# Patient Record
Sex: Male | Born: 1995 | Hispanic: Yes | Marital: Single | Smoking: Never smoker
Health system: Southern US, Community
[De-identification: ages and names within clinical notes are randomized; demographics above are authoritative.]

## PROBLEM LIST (undated history)

## (undated) ENCOUNTER — Ambulatory Visit: Payer: Self-pay

## (undated) DIAGNOSIS — Z8669 Personal history of other diseases of the nervous system and sense organs: Secondary | ICD-10-CM

## (undated) DIAGNOSIS — J45909 Unspecified asthma, uncomplicated: Secondary | ICD-10-CM

## (undated) HISTORY — PX: TYMPANOSTOMY TUBE PLACEMENT: SHX32

---

## 2016-10-19 ENCOUNTER — Emergency Department (HOSPITAL_COMMUNITY): Payer: No Typology Code available for payment source

## 2016-10-19 ENCOUNTER — Encounter (HOSPITAL_COMMUNITY): Payer: Self-pay | Admitting: Emergency Medicine

## 2016-10-19 ENCOUNTER — Emergency Department (HOSPITAL_COMMUNITY)
Admission: EM | Admit: 2016-10-19 | Discharge: 2016-10-19 | Disposition: A | Discharge status: Home / Self Care | Payer: No Typology Code available for payment source | Attending: Emergency Medicine | Admitting: Emergency Medicine

## 2016-10-19 DIAGNOSIS — Y939 Activity, unspecified: Secondary | ICD-10-CM | POA: Insufficient documentation

## 2016-10-19 DIAGNOSIS — Y92410 Unspecified street and highway as the place of occurrence of the external cause: Secondary | ICD-10-CM | POA: Insufficient documentation

## 2016-10-19 DIAGNOSIS — S20212A Contusion of left front wall of thorax, initial encounter: Secondary | ICD-10-CM | POA: Insufficient documentation

## 2016-10-19 DIAGNOSIS — S0990XA Unspecified injury of head, initial encounter: Secondary | ICD-10-CM | POA: Diagnosis not present

## 2016-10-19 DIAGNOSIS — R079 Chest pain, unspecified: Secondary | ICD-10-CM | POA: Diagnosis not present

## 2016-10-19 DIAGNOSIS — S79922A Unspecified injury of left thigh, initial encounter: Secondary | ICD-10-CM | POA: Diagnosis present

## 2016-10-19 DIAGNOSIS — S7012XA Contusion of left thigh, initial encounter: Secondary | ICD-10-CM | POA: Insufficient documentation

## 2016-10-19 DIAGNOSIS — R109 Unspecified abdominal pain: Secondary | ICD-10-CM | POA: Insufficient documentation

## 2016-10-19 DIAGNOSIS — S0001XA Abrasion of scalp, initial encounter: Secondary | ICD-10-CM | POA: Diagnosis not present

## 2016-10-19 DIAGNOSIS — S40012A Contusion of left shoulder, initial encounter: Secondary | ICD-10-CM | POA: Insufficient documentation

## 2016-10-19 DIAGNOSIS — J45909 Unspecified asthma, uncomplicated: Secondary | ICD-10-CM | POA: Insufficient documentation

## 2016-10-19 DIAGNOSIS — Y999 Unspecified external cause status: Secondary | ICD-10-CM | POA: Diagnosis not present

## 2016-10-19 DIAGNOSIS — S7010XA Contusion of unspecified thigh, initial encounter: Secondary | ICD-10-CM

## 2016-10-19 DIAGNOSIS — S301XXA Contusion of abdominal wall, initial encounter: Secondary | ICD-10-CM | POA: Diagnosis not present

## 2016-10-19 HISTORY — DX: Unspecified asthma, uncomplicated: J45.909

## 2016-10-19 HISTORY — DX: Personal history of other diseases of the nervous system and sense organs: Z86.69

## 2016-10-19 LAB — CBC
HEMATOCRIT: 47.1 % (ref 39.0–52.0)
HEMOGLOBIN: 16 g/dL (ref 13.0–17.0)
MCH: 28.2 pg (ref 26.0–34.0)
MCHC: 34 g/dL (ref 30.0–36.0)
MCV: 83.1 fL (ref 78.0–100.0)
Platelets: 235 10*3/uL (ref 150–400)
RBC: 5.67 MIL/uL (ref 4.22–5.81)
RDW: 12.6 % (ref 11.5–15.5)
WBC: 16.4 10*3/uL — ABNORMAL HIGH (ref 4.0–10.5)

## 2016-10-19 LAB — COMPREHENSIVE METABOLIC PANEL
ALK PHOS: 108 U/L (ref 38–126)
ALT: 17 U/L (ref 17–63)
AST: 26 U/L (ref 15–41)
Albumin: 4.6 g/dL (ref 3.5–5.0)
Anion gap: 12 (ref 5–15)
BUN: 14 mg/dL (ref 6–20)
CO2: 23 mmol/L (ref 22–32)
CREATININE: 0.8 mg/dL (ref 0.61–1.24)
Calcium: 9.3 mg/dL (ref 8.9–10.3)
Chloride: 103 mmol/L (ref 101–111)
Glucose, Bld: 113 mg/dL — ABNORMAL HIGH (ref 65–99)
Potassium: 4 mmol/L (ref 3.5–5.1)
Sodium: 138 mmol/L (ref 135–145)
Total Bilirubin: 0.6 mg/dL (ref 0.3–1.2)
Total Protein: 8.5 g/dL — ABNORMAL HIGH (ref 6.5–8.1)

## 2016-10-19 LAB — CDS SEROLOGY

## 2016-10-19 LAB — ETHANOL: Alcohol, Ethyl (B): <5 mg/dL (ref <5)

## 2016-10-19 LAB — I-STAT CHEM 8, ED
BUN: 17 mg/dL (ref 6–20)
CHLORIDE: 104 mmol/L (ref 101–111)
Calcium, Ion: 1.11 mmol/L — ABNORMAL LOW (ref 1.15–1.40)
Creatinine, Ser: 0.7 mg/dL (ref 0.61–1.24)
GLUCOSE: 110 mg/dL — AB (ref 65–99)
HCT: 48 % (ref 39.0–52.0)
Hemoglobin: 16.3 g/dL (ref 13.0–17.0)
POTASSIUM: 3.9 mmol/L (ref 3.5–5.1)
SODIUM: 142 mmol/L (ref 135–145)
TCO2: 27 mmol/L (ref 0–100)

## 2016-10-19 LAB — PROTIME-INR
INR: 1.01
PROTHROMBIN TIME: 13.4 s (ref 11.4–15.2)

## 2016-10-19 LAB — SAMPLE TO BLOOD BANK

## 2016-10-19 LAB — I-STAT CG4 LACTIC ACID, ED: LACTIC ACID, VENOUS: 1.37 mmol/L (ref 0.5–1.9)

## 2016-10-19 MED ORDER — IBUPROFEN 800 MG PO TABS: 800.0000 mg | ORAL_TABLET | Freq: Three times a day (TID) | ORAL | 0 refills | Status: AC

## 2016-10-19 MED ORDER — SODIUM CHLORIDE 0.9 % IV BOLUS (SEPSIS)
1000.0000 mL | Freq: Once | INTRAVENOUS | Status: AC
  Administered 2016-10-19: 1000 mL via INTRAVENOUS

## 2016-10-19 MED ORDER — CYCLOBENZAPRINE HCL 5 MG PO TABS: 5.0000 mg | ORAL_TABLET | Freq: Three times a day (TID) | ORAL | 0 refills | Status: AC | PRN

## 2016-10-19 MED ORDER — IOPAMIDOL (ISOVUE-300) INJECTION 61%
INTRAVENOUS | Status: AC
  Administered 2016-10-19: 100 mL
  Filled 2016-10-19: qty 100

## 2016-10-19 MED ORDER — HYDROCODONE-ACETAMINOPHEN 5-325 MG PO TABS: 1.0000 | ORAL_TABLET | ORAL | 0 refills | Status: AC | PRN

## 2016-10-19 MED ORDER — MORPHINE SULFATE (PF) 4 MG/ML IV SOLN
4.0000 mg | Freq: Once | INTRAVENOUS | Status: AC
  Administered 2016-10-19: 4 mg via INTRAVENOUS
  Filled 2016-10-19: qty 1

## 2016-10-19 NOTE — ED Notes (Signed)
Patient identified as self by self. A&O x4.
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
## 2016-10-19 NOTE — ED Notes (Signed)
Family updated as to patient's status.

## 2016-10-19 NOTE — ED Notes (Signed)
Family at beside. Family given emotional support. 

## 2016-10-19 NOTE — ED Notes (Signed)
Vital signs stable. 
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
## 2016-10-19 NOTE — Discharge Instructions (Signed)
Take motrin for pain.   Take vicodin for severe pain. Do NOT drive with it.   Take flexeril for muscle spasms.   Rest for 2 days, expect to be stiff and sore all over.  Use crutches for comfort but you can bear weight on the leg./   See your doctor  Return to ER if you have severe chest pain, abdominal pain, leg pain, vomiting, fevers.
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
